# Patient Record
Sex: Male | Born: 1950 | Marital: Married
Health system: Midwestern US, Community
[De-identification: ages and names within clinical notes are randomized; demographics above are authoritative.]

---

## 2014-02-13 ENCOUNTER — Ambulatory Visit: Admit: 2014-02-13 | Discharge: 2014-02-13 | Attending: Family Medicine

## 2014-02-13 DIAGNOSIS — Z0289 Encounter for other administrative examinations: Secondary | ICD-10-CM

## 2014-02-13 NOTE — Progress Notes (Signed)
Subjective:      Patient ID: Launa GrillJoseph L Tatham is a 64 y.o. male.    HPI  FAA Exam form 8500 on file SECOND CLASS  Review of Systems    Objective:   Physical Exam  FAA Exam form 8500 on file    Assessment:      1. Encounter for [employer] Company[employer] Administration (FAA) examination       SECOND CLASS       Plan:      FAA Exam form 8500 on file

## 2015-03-20 ENCOUNTER — Ambulatory Visit: Admit: 2015-03-20 | Discharge: 2015-03-20 | Attending: Family Medicine

## 2015-03-20 DIAGNOSIS — Z0289 Encounter for other administrative examinations: Secondary | ICD-10-CM

## 2015-03-20 NOTE — Progress Notes (Signed)
Subjective:      Patient ID: Launa GrillJoseph L Ledlow is a 65 y.o. male.    HPI  FAA Exam form 8500 on file    Review of Systems    Objective:   Physical Exam  FAA Exam form 8500 on file    Assessment:      1. Encounter for [employer] Company[employer] Aviation Administration (FAA) examination             Plan:      FAA Exam form 8500 on file

## 2020-02-13 ENCOUNTER — Ambulatory Visit: Payer: Self-pay | Admitting: Family Medicine

## 2020-02-18 ENCOUNTER — Ambulatory Visit (INDEPENDENT_AMBULATORY_CARE_PROVIDER_SITE_OTHER): Payer: Commercial Managed Care - PPO | Admitting: Family Medicine

## 2020-02-18 ENCOUNTER — Other Ambulatory Visit: Payer: Self-pay

## 2020-02-18 DIAGNOSIS — M79601 Pain in right arm: Secondary | ICD-10-CM

## 2020-02-18 MED ORDER — PREDNISONE 10 MG PO TABS: ORAL_TABLET | ORAL | 0 refills | Status: DC

## 2020-02-18 NOTE — Progress Notes (Signed)
   Office Visit Note   Patient: Adam Lindsey           Date of Birth: Dec 02, 1950           MRN: [id number] Visit Date: 02/18/2020 Requested by: No referring provider defined for this encounter. PCP: No primary care provider on file.  Subjective: Chief Complaint  Patient presents with  . Right Shoulder - Pain    Had minor intermittent pain in the shoulder x several months. 10 days ago the pain worsened. He saw someone at his primary care office - was diagnosed with and treated for shingles (had a rash on his arm). Also, has a nodule that has come up on the right thumb - thumb is swollen.    HPI: He is here with right arm pain.  He had intermittent pain in his shoulder for several months, but about 10 days ago he developed increasing pain and a rash.  He went to his PCP and was diagnosed with shingles, treated with Lyrica and Valtrex.  His symptoms are more manageable with this but are not going away.  He has tingling into his thumb, he noticed a nodule in his and still having quite a bit of pain in his shoulder.  He is right-hand dominant.  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  No previous problems like this.  Is otherwise been in good health except for prostate enlargement.                ROS:   All other systems were reviewed and are negative.  Objective: Vital Signs: There were no vitals taken for this visit.  Physical Exam:  General:  Alert and oriented, in no acute distress. Pulm:  Breathing unlabored. Psy:  Normal mood, congruent affect. Skin: He has resolving rash in his right arm. Right shoulder: He has full range of motion, pain with overhead reach.  Tender in the posterior subacromial space.  Rotator cuff strength is 5/5 throughout.  5/5 deltoid, biceps, biceps, wrist and intrinsic hand strength.   Imaging: No results found.  Assessment & Plan: 1.  Acute on chronic right shoulder pain, suspect that he has some impingement in his  shoulder but that his acute severe pain is related to shingles. -We will try a prednisone taper.  If his shoulder pain persist, consider subacromial injection. -Desk work for the next 2 weeks while on Lyrica.     Procedures: No procedures performed        PMFS History: There are no problems to display for this patient.  No past medical history on file.  No family history on file.   Social History   Occupational History  . Not on file  Tobacco Use  . Smoking status: Not on file  . Smokeless tobacco: Not on file  Substance and Sexual Activity  . Alcohol use: Not on file  . Drug use: Not on file  . Sexual activity: Not on file

## 2020-02-19 ENCOUNTER — Ambulatory Visit: Payer: Self-pay | Admitting: Family Medicine

## 2020-02-21 ENCOUNTER — Ambulatory Visit: Payer: Self-pay | Admitting: Family Medicine

## 2020-02-27 ENCOUNTER — Encounter: Payer: Self-pay | Admitting: Family Medicine

## 2020-02-27 ENCOUNTER — Telehealth: Payer: Self-pay

## 2020-02-27 MED ORDER — PREGABALIN 150 MG PO CAPS
150.0000 mg | ORAL_CAPSULE | Freq: Two times a day (BID) | ORAL | 3 refills | Status: DC
Start: 2020-02-27 — End: 2020-02-27

## 2020-02-27 MED ORDER — PREGABALIN 200 MG PO CAPS: 200.0000 mg | ORAL_CAPSULE | Freq: Two times a day (BID) | ORAL | 3 refills | Status: DC

## 2020-02-27 NOTE — Telephone Encounter (Signed)
Yes, I forgot he was on 150 mg twice daily before, so increased to 200 mg.  Ok to fill.

## 2020-02-27 NOTE — Telephone Encounter (Signed)
I called the pharmacy and left a voice mail, advising that the Rx for 200 mg bid is the correct one and it is ok to fill it early.

## 2020-02-27 NOTE — Telephone Encounter (Signed)
[facility] called and needs clarification on medication (Lyrica). Would like to know which Rx to fill. There is multiple Lyrica Rx on file and is it okay to Fill early? Would like CB to discuss.      CB: [phone]

## 2020-02-27 NOTE — Addendum Note (Signed)
Addended by: Hortencia Pilar on: 02/27/2020 01:33 PM   Modules accepted: Orders

## 2020-02-27 NOTE — Telephone Encounter (Signed)
Please advise. Is the 200 mg at bid the right one? Is it ok to fill it early?

## 2020-03-05 ENCOUNTER — Encounter: Payer: Self-pay | Admitting: Family Medicine

## 2020-03-05 DIAGNOSIS — B0229 Other postherpetic nervous system involvement: Secondary | ICD-10-CM

## 2020-03-05 MED ORDER — NORTRIPTYLINE HCL 10 MG PO CAPS: ORAL_CAPSULE | ORAL | 3 refills | Status: AC

## 2020-03-05 MED ORDER — LIDOCAINE 5 % EX PTCH: 1.0000 | MEDICATED_PATCH | CUTANEOUS | 11 refills | Status: AC

## 2020-03-06 ENCOUNTER — Encounter: Payer: Self-pay | Admitting: Family Medicine

## 2020-03-07 ENCOUNTER — Other Ambulatory Visit: Payer: Self-pay

## 2020-03-07 ENCOUNTER — Ambulatory Visit (INDEPENDENT_AMBULATORY_CARE_PROVIDER_SITE_OTHER): Payer: Medicare Other | Admitting: Family Medicine

## 2020-03-07 DIAGNOSIS — M79601 Pain in right arm: Secondary | ICD-10-CM | POA: Diagnosis not present

## 2020-03-07 DIAGNOSIS — N400 Enlarged prostate without lower urinary tract symptoms: Secondary | ICD-10-CM | POA: Insufficient documentation

## 2020-03-07 DIAGNOSIS — H903 Sensorineural hearing loss, bilateral: Secondary | ICD-10-CM | POA: Insufficient documentation

## 2020-03-07 DIAGNOSIS — L570 Actinic keratosis: Secondary | ICD-10-CM | POA: Insufficient documentation

## 2020-03-07 DIAGNOSIS — H918X9 Other specified hearing loss, unspecified ear: Secondary | ICD-10-CM | POA: Insufficient documentation

## 2020-03-07 NOTE — Progress Notes (Signed)
   Office Visit Note   Patient: Adam Lindsey           Date of Birth: Dec 18, 1950           MRN: [id number] Visit Date: 03/07/2020 Requested by: No referring provider defined for this encounter. PCP: No primary care provider on file.  Subjective: Chief Complaint  Patient presents with  . Right Shoulder - Pain    Planned cortisone injection. What is the plan after this?    HPI: He is here with persistent right shoulder pain.  He is having ongoing pain deep within the right shoulder as well as shingles pain radiating into the arm down to the thumb.  He is hoping injection would help with his shoulder pain.              ROS:   All other systems were reviewed and are negative.  Objective: Vital Signs: There were no vitals taken for this visit.  Physical Exam:  General:  Alert and oriented, in no acute distress. Pulm:  Breathing unlabored. Psy:  Normal mood, congruent affect. Skin: Shingles rash is almost resolved. Right shoulder: Full active range of motion, 5/5 rotator cuff strength.  Tender in the posterior subacromial space.  Biceps, triceps, wrist and intrinsic hand strength are normal.  Imaging: No results found.  Assessment & Plan: 1.  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 -Discussed with him and elected to inject the subacromial space.  If he still has severe pain we will order x-rays and MRI scan of the shoulder.     Procedures: Right shoulder subacromial injection: After sterile prep with Betadine, injected 5 cc 0.25% bupivacaine and 6 mg betamethasone from posterior approach.       PMFS History: Patient Active Problem List   Diagnosis Date Noted  . Actinic keratosis 03/07/2020  . Benign prostatic hyperplasia 03/07/2020  . Other specified forms of hearing loss 03/07/2020  . 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  No family history on file.   Social History   Occupational History   . Not on file  Tobacco Use  . Smoking status: Not on file  . Smokeless tobacco: Not on file  Substance and Sexual Activity  . Alcohol use: Not on file  . Drug use: Not on file  . Sexual activity: Not on file

## 2020-03-09 ENCOUNTER — Encounter: Payer: Self-pay | Admitting: Family Medicine

## 2020-03-09 DIAGNOSIS — M79601 Pain in right arm: Secondary | ICD-10-CM

## 2020-03-20 ENCOUNTER — Other Ambulatory Visit: Payer: Self-pay

## 2020-03-20 ENCOUNTER — Ambulatory Visit
Admission: RE | Admit: 2020-03-20 | Discharge: 2020-03-20 | Disposition: A | Discharge status: Home / Self Care | Payer: Commercial Managed Care - PPO | Source: Ambulatory Visit | Attending: Family Medicine | Admitting: Family Medicine

## 2020-03-20 DIAGNOSIS — M79601 Pain in right arm: Secondary | ICD-10-CM

## 2020-03-24 ENCOUNTER — Other Ambulatory Visit: Payer: Self-pay | Admitting: Family Medicine

## 2020-03-24 ENCOUNTER — Telehealth: Payer: Self-pay | Admitting: Family Medicine

## 2020-03-24 DIAGNOSIS — M79601 Pain in right arm: Secondary | ICD-10-CM

## 2020-03-24 DIAGNOSIS — B0229 Other postherpetic nervous system involvement: Secondary | ICD-10-CM

## 2020-03-24 MED ORDER — PREGABALIN 200 MG PO CAPS: 200.0000 mg | ORAL_CAPSULE | Freq: Two times a day (BID) | ORAL | 3 refills | Status: DC

## 2020-03-24 NOTE — Telephone Encounter (Signed)
MRI shows irritation of the supraspinatus and infraspinatus tendons with a small partial tear of the supraspinatus.  These can often heal without surgery, so physical therapy would be a consideration.  If therapy doesn't help, then surgical consult.

## 2020-03-27 ENCOUNTER — Ambulatory Visit: Payer: Commercial Managed Care - PPO | Attending: Family Medicine | Admitting: Physical Therapy

## 2020-03-27 ENCOUNTER — Other Ambulatory Visit: Payer: Self-pay

## 2020-03-27 ENCOUNTER — Encounter: Payer: Self-pay | Admitting: Physical Therapy

## 2020-03-27 DIAGNOSIS — M25511 Pain in right shoulder: Secondary | ICD-10-CM | POA: Insufficient documentation

## 2020-03-27 DIAGNOSIS — G8929 Other chronic pain: Secondary | ICD-10-CM | POA: Diagnosis present

## 2020-03-27 DIAGNOSIS — R293 Abnormal posture: Secondary | ICD-10-CM | POA: Diagnosis present

## 2020-03-27 DIAGNOSIS — M6281 Muscle weakness (generalized): Secondary | ICD-10-CM | POA: Diagnosis present

## 2020-03-27 DIAGNOSIS — R29898 Other symptoms and signs involving the musculoskeletal system: Secondary | ICD-10-CM | POA: Diagnosis present

## 2020-03-27 NOTE — Therapy (Signed)
[facility] [address]  [place], [place], [address] Phone: [phone]   Fax:  [phone]  Physical Therapy Evaluation  Patient Details  Name: Adam Lindsey MRN: [id number] Date of Birth: 05/03/1950 Referring Provider (PT): Eunice Blase, MD   Encounter Date: 03/27/2020   PT End of Session - 03/27/20 1401    Visit Number 1    Number of Visits 12    Date for PT Re-Evaluation 05/08/20    Authorization Type UHC, Medicare & Tricare    PT Start Time 1401    PT Stop Time 1454    PT Time Calculation (min) 53 min    Activity Tolerance Patient tolerated treatment well    Behavior During Therapy [facility] for tasks assessed/performed           History reviewed. No pertinent past medical history.  History reviewed. No pertinent surgical history.  There were no vitals filed for this visit.    Subjective Assessment - 03/27/20 1408    Subjective Pt reports h/o swimming and playing volleyball where he would have intermittent pain. More recently pain has become more constant and intense for the past 5-6 months which he feels like may have originated from working out. Received coritisone injection with 1-2 days of relief but then wore off.    Patient is accompained by: Family member   wife   Pertinent History 5-6 months ago aggravated by working out    Limitations Lifting    Diagnostic tests 03/20/20 - R shoulder MRI: 1. Moderate tendinosis of the supraspinatus tendon with a small partial-thickness bursal surface tear.  2. Mild tendinosis of the infraspinatus tendon. Severe arthropathy of the acromioclavicular joint.    Patient Stated Goals relieve the pain to get back to work and working out    Currently in Pain? No/denies    Pain Score 0-No pain    Pain Location Shoulder    Pain Orientation Right;Anterior    Pain Descriptors / Indicators Aching    Pain Type Chronic pain;Neuropathic pain    Pain Radiating Towards  Neuropathic pain & loss of sensation in R lateral forearm, thumb, and index finger 2 shingles    Pain Onset More than a month ago    Pain Frequency Intermittent    Aggravating Factors  Lifting aggravates, laying on the shoulder   7-8/10   Pain Relieving Factors Ice and ibuprofen, uses ice at night    Effect of Pain on Daily Activities feels like there's full motion but can be painful overhead and limited with lifting              [facility] PT Assessment - 03/27/20 1424      Assessment   Medical Diagnosis Right shoulder pain - probable impingement    Referring Provider (PT) Eunice Blase, MD    Onset Date/Surgical Date --   5-6 months ago   Hand Dominance Right    Next MD Visit PRN    Prior Therapy None      Precautions   Precautions None      Restrictions   Weight Bearing Restrictions No      Balance Screen   Has the patient fallen in the past 6 months Yes    How many times? 1   tripped over laundry basket   Has the patient had a decrease in activity level because of a fear of falling?  No    Is the patient reluctant to leave their home  because of a fear of falling?  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    [employer] gym, pretty active, 3x/week      Cognition   Overall Cognitive Status Within Functional Limits for tasks assessed      Sensation   Additional Comments Neuropathic pain & loss of sensation in R lateral forearm, thumb, and index finger 2 shingles      Posture/Postural Control   Posture/Postural Control Postural limitations    Postural Limitations Rounded Shoulders     Posture Comments Slighlty protracted R scapula 1-2cm      ROM / Strength   AROM / PROM / Strength AROM;Strength      AROM   Right Shoulder Flexion 157 Degrees    Right Shoulder ABduction 168 Degrees    Right Shoulder Internal Rotation --   FIR to T10 - more lateral over ribs   Right Shoulder External Rotation --   FER to T3   Left Shoulder Flexion 166 Degrees    Left Shoulder ABduction 167 Degrees    Left Shoulder Internal Rotation --   FIR to T10 at spinus process   Left Shoulder External Rotation --   FER to T4     Strength   Right Shoulder Flexion 4+/5    Right Shoulder ABduction 4/5   Painful   Right Shoulder Internal Rotation 4+/5    Right Shoulder External Rotation 4/5    Left Shoulder Flexion 5/5    Left Shoulder ABduction 5/5    Left Shoulder Internal Rotation 5/5    Left Shoulder External Rotation 4+/5      Palpation   Palpation comment TTP at Coracoid process, Very TTP Anterior deltoid with prominent taut identified, TTP lateral deltoid                      Objective measurements completed on examination: See above findings.               PT Education - 03/27/20 1447    Education Details PT eval findings, anticipated POC & initial HEP - Access Code: [email or web address]    Person(s) Educated Patient;Spouse    Methods Explanation;Demonstration;Verbal cues;Tactile cues;Handout    Comprehension Verbalized understanding;Verbal cues required;Tactile cues required;Returned demonstration;Need further instruction            PT Short Term Goals - 03/27/20 1454      PT SHORT TERM GOAL #1   Title Patient will be independent with initial HEP    Status New             PT Long Term Goals - 03/27/20 1454      PT LONG TERM GOAL #1   Title Patient will be independent with ongoing/advanced HEP +/- home gym program for self-management at home    Status New    Target Date 05/08/20      PT LONG TERM GOAL #2   Title Improve posture and alignment with  patient to demonstrate improved upright posture with posterior shoulder girdle engaged  Status New    Target Date 05/08/20      PT LONG TERM GOAL #3   Title Patient to improve R shoulder AROM to WNL without pain provocation    Status New    Target Date 05/08/20      PT LONG TERM GOAL #4   Title Patient will demonstrate improved R shoulder strength to >/= 5/5 for functional UE use    Status New    Target Date 05/08/20      PT LONG TERM GOAL #5   Title Patient to return to working out w/o limitation due to R shoulder pain    Status New    Target Date 05/08/20      PT LONG TERM GOAL #6   Title Patient to report ability to perform work and daily activities including lifting/carrying his suitcase without pain provocation    Status New    Target Date 05/08/20                  Plan - 03/27/20 1454    Clinical Impression Statement Adam Lindsey is a 70 y/o male who presents to OP PT for R shoulder pain along with R arm pain 2 post herpetic neuralgia. He attributes onset of R shoulder pain to potentially overdoing it while working out ~5-6 months ago. Before he could seek treatment for the shoulder, he developed shingles affecting his R arm and still has lingering neuropathic pain and loss of sensation in R forearm and thenar aspect of his hand as well as thumb & index finger due to the shingles. He has recently received an injection at the MD office w/o lasting relief noted. Recent MRI revealed moderate tendinosis of the supraspinatus tendon with a small partial-thickness bursal surface tear along with mild tendinosis of the infraspinatus tendon and severe arthropathy of the AC joint. Current deficits include postural abnormalities, very mildly limited R shoulder AROM, mildly decreased R shoulder strength and limited functional use of R arm especially with lifting. Adam Lindsey will benefit from skilled PT to address above deficits, improve posture and restore pain-free functional ROM and strength in R  shoulder to allow him to resume normal daily activities and working out without pain interference.    Personal Factors and Comorbidities Time since onset of injury/illness/exacerbation;Past/Current Experience;Comorbidity 2    Comorbidities recent episode of shingles in R UE - neuropathic pain & loss of sensation in R lateral forearm, thumb, and index finger 2 shingles; heraing loss    Examination-Activity Limitations Lift;Carry;Reach Overhead;Sleep    Examination-Participation Restrictions Occupation;Community Activity;Yard Work    Stability/Clinical Decision Making Stable/Uncomplicated    Designer, jewellery Low    Rehab Potential Good    PT Frequency 2x / week   1-2x/wk pending work schedule   PT Duration 6 weeks    PT Treatment/Interventions ADLs/Self Care Home Management;Cryotherapy;Electrical Stimulation;Iontophoresis 4mg /ml Dexamethasone;Moist Heat;Ultrasound;Therapeutic activities;Therapeutic exercise;Neuromuscular re-education;Patient/family education;Manual techniques;Passive range of motion;Dry needling;Taping;Vasopneumatic Device;Joint Manipulations    PT Next Visit Plan Shoulder FOTO; review initial HEP; postural stretching and scapular strengthening; manual therapy and modalities including possible ionto patch as indicated    PT Home Exercise Plan Access Code: [email or web address] (3/17)    Consulted and Agree with Plan of Care Patient;Family member/caregiver    Family Member Consulted wife           Patient will benefit from skilled therapeutic intervention in order to improve the following deficits and impairments:  Decreased activity tolerance,Decreased range of motion,Decreased strength,Increased muscle spasms,Impaired perceived functional ability,Impaired UE  functional use,Improper body mechanics,Postural dysfunction,Pain  Visit Diagnosis: Chronic right shoulder pain  Abnormal posture  Other symptoms and signs involving the musculoskeletal system  Muscle weakness  (generalized)     Problem List Patient Active Problem List   Diagnosis Date Noted  . Actinic keratosis 03/07/2020  . Benign prostatic hyperplasia 03/07/2020  . Other specified forms of hearing loss 03/07/2020  . 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 [address]  [place], [place], [address] Phone: [phone]   Fax:  [phone]  Name: Trejuan Matherne MRN: [id number] Date of Birth: August 04, 1950

## 2020-03-27 NOTE — Patient Instructions (Signed)
      Access Code: [email or web address] URL: [email or web address] Date: 03/27/2020 Prepared by: Annie Paras  Exercises Doorway Pec Stretch at 60 Degrees Abduction with Arm Straight - 2-3 x daily - 7 x weekly - 3 reps - 30 sec hold Doorway Pec Stretch at 90 Degrees Abduction - 2-3 x daily - 7 x weekly - 3 reps - 30 sec hold Doorway Pec Stretch at 120 Degrees Abduction - 2-3 x daily - 7 x weekly - 3 reps - 30 sec hold Standing Bilateral Low Shoulder Row with Anchored Resistance - 1 x daily - 7 x weekly - 2 sets - 10 reps - 5 sec hold Scapular Retraction with Resistance Advanced - 1 x daily - 7 x weekly - 2 sets - 10 reps - 5 sec hold

## 2020-03-28 ENCOUNTER — Encounter: Payer: Self-pay | Admitting: Family Medicine

## 2020-03-29 ENCOUNTER — Other Ambulatory Visit: Payer: No Typology Code available for payment source

## 2020-03-30 ENCOUNTER — Encounter: Payer: Self-pay | Admitting: Family Medicine

## 2020-03-31 ENCOUNTER — Telehealth: Payer: Self-pay

## 2020-03-31 MED ORDER — PREGABALIN 50 MG PO CAPS: ORAL_CAPSULE | ORAL | 0 refills | Status: AC

## 2020-03-31 NOTE — Telephone Encounter (Signed)
heather from [employer] called she stated the patient received lyrica 200 mg last week she just wants to make sure patient is being tapered off of lyrica call back:[phone]

## 2020-03-31 NOTE — Telephone Encounter (Signed)
I called and spoke with Barnetta Chapel in the pharmacy, confirming that the patient is tapering off of Lyrica.

## 2020-04-02 ENCOUNTER — Ambulatory Visit: Payer: Commercial Managed Care - PPO

## 2020-04-02 ENCOUNTER — Other Ambulatory Visit: Payer: Self-pay

## 2020-04-02 VITALS — BP 128/70

## 2020-04-02 DIAGNOSIS — R29898 Other symptoms and signs involving the musculoskeletal system: Secondary | ICD-10-CM

## 2020-04-02 DIAGNOSIS — M25511 Pain in right shoulder: Secondary | ICD-10-CM

## 2020-04-02 DIAGNOSIS — R293 Abnormal posture: Secondary | ICD-10-CM

## 2020-04-02 DIAGNOSIS — M6281 Muscle weakness (generalized): Secondary | ICD-10-CM

## 2020-04-02 DIAGNOSIS — G8929 Other chronic pain: Secondary | ICD-10-CM

## 2020-04-02 NOTE — Therapy (Signed)
[facility] [address]  [place], [place], [address] Phone: [phone]   Fax:  [phone]  Physical Therapy Treatment  Patient Details  Name: Adam Lindsey MRN: [id number] Date of Birth: 1950/02/03 Referring Provider (PT): Eunice Blase, MD   Encounter Date: 04/02/2020   PT End of Session - 04/02/20 1745    Visit Number 2    Number of Visits 12    Date for PT Re-Evaluation 05/08/20    Authorization Type UHC, Medicare & Tricare    PT Start Time 1656    PT Stop Time 1739    PT Time Calculation (min) 43 min    Activity Tolerance Patient tolerated treatment well    Behavior During Therapy [facility] for tasks assessed/performed           History reviewed. No pertinent past medical history.  History reviewed. No pertinent surgical history.  Vitals:   04/02/20 1746  BP: 128/70     Subjective Assessment - 04/02/20 1658    Subjective Pt reports the exercises have been going well for him, no pain today.    Diagnostic tests 03/20/20 - R shoulder MRI: 1. Moderate tendinosis of the supraspinatus tendon with a small partial-thickness bursal surface tear.  2. Mild tendinosis of the infraspinatus tendon. Severe arthropathy of the acromioclavicular joint.    Patient Stated Goals relieve the pain to get back to work and working out    Currently in Pain? No/denies              [facility] PT Assessment - 04/02/20 0001      Observation/Other Assessments   Focus on Therapeutic Outcomes (FOTO)  Shoulder: 63, Predicted: 71                         [facility] Adult PT Treatment/Exercise - 04/02/20 0001      Exercises   Exercises Shoulder      Shoulder Exercises: Standing   External Rotation Strengthening;Right;10 reps;Theraband    Theraband Level (Shoulder External Rotation) Level 3 (Green)    Internal Rotation Strengthening;Right;10 reps;Theraband    Theraband Level (Shoulder Internal Rotation) Level 3 (Green)     Flexion AAROM;Both;10 reps;Limitations    Flexion Limitations cane    ABduction AAROM;Right;10 reps;Limitations    ABduction Limitations cane    Extension Strengthening;Both;10 reps;Theraband    Theraband Level (Shoulder Extension) Level 3 (Green)    Row Strengthening;Both;10 reps;Theraband    Theraband Level (Shoulder Row) Level 3 (Green)      Shoulder Exercises: ROM/Strengthening   UBE (Upper Arm Bike) L1 2 min fwd, 2 min back    Wall Wash flexion and abduction 10 reps      Shoulder Exercises: Stretch   Other Shoulder Stretches chest doorway stretch 3 way 30 sec B                  PT Education - 04/02/20 1743    Education Details HEP update: Access Code: [email or web address]    Person(s) Educated Patient    Methods Explanation;Demonstration;Tactile cues;Verbal cues;Handout    Comprehension Verbalized understanding;Returned demonstration;Verbal cues required;Tactile cues required;Need further instruction            PT Short Term Goals - 04/02/20 1852      PT SHORT TERM GOAL #1   Title Patient will be independent with initial HEP    Status On-going             PT  Long Term Goals - 04/02/20 1853      PT LONG TERM GOAL #1   Title Patient will be independent with ongoing/advanced HEP +/- home gym program for self-management at home    Status On-going      PT LONG TERM GOAL #2   Title Improve posture and alignment with patient to demonstrate improved upright posture with posterior shoulder girdle engaged    Status On-going      PT LONG TERM GOAL #3   Title Patient to improve R shoulder AROM to WNL without pain provocation    Status On-going      PT LONG TERM GOAL #4   Title Patient will demonstrate improved R shoulder strength to >/= 5/5 for functional UE use    Status On-going      PT LONG TERM GOAL #5   Title Patient to return to working out w/o limitation due to R shoulder pain    Status On-going      PT LONG TERM GOAL #6   Title Patient to report ability  to perform work and daily activities including lifting/carrying his suitcase without pain provocation    Status On-going                 Plan - 04/02/20 1746    Clinical Impression Statement Pt responded well to the treatment, reviewed his HEP to ensure proper performance and understanding, he demonstrated the exercises well after being instructed on proper form and technique. No reports of pain during the session, he had some reports of "discomfort" in his shoulder when reaching into abduction and showed decreased ROM. Introduced more RTC and scap stability exercises today, some cues needed for proper forearm and shoulder placement with IR/ER but no complaints with the exercises.    Personal Factors and Comorbidities Time since onset of injury/illness/exacerbation;Past/Current Experience;Comorbidity 2    Comorbidities recent episode of shingles in R UE - neuropathic pain & loss of sensation in R lateral forearm, thumb, and index finger 2 shingles; heraing loss    PT Frequency 2x / week    PT Duration 6 weeks    PT Treatment/Interventions ADLs/Self Care Home Management;Cryotherapy;Electrical Stimulation;Iontophoresis 4mg /ml Dexamethasone;Moist Heat;Ultrasound;Therapeutic activities;Therapeutic exercise;Neuromuscular re-education;Patient/family education;Manual techniques;Passive range of motion;Dry needling;Taping;Vasopneumatic Device;Joint Manipulations    PT Next Visit Plan postural stretching and scapular strengthening; manual therapy and modalities including possible ionto patch as indicated    PT Home Exercise Plan Access Code: [email or web address] (3/17)    Consulted and Agree with Plan of Care Patient;Family member/caregiver    Family Member Consulted wife           Patient will benefit from skilled therapeutic intervention in order to improve the following deficits and impairments:  Decreased activity tolerance,Decreased range of motion,Decreased strength,Increased muscle spasms,Impaired  perceived functional ability,Impaired UE functional use,Improper body mechanics,Postural dysfunction,Pain  Visit Diagnosis: Chronic right shoulder pain  Abnormal posture  Other symptoms and signs involving the musculoskeletal system  Muscle weakness (generalized)     Problem List Patient Active Problem List   Diagnosis Date Noted  . Actinic keratosis 03/07/2020  . Benign prostatic hyperplasia 03/07/2020  . Other specified forms of hearing loss 03/07/2020  . 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  [place], [place], [address] Phone: [phone]   Fax:  [phone]  Name: Anguel Delapena MRN: [id number] Date of Birth: 09/08/1950

## 2020-04-03 ENCOUNTER — Other Ambulatory Visit: Payer: Self-pay | Admitting: Urology

## 2020-04-03 ENCOUNTER — Encounter: Payer: Self-pay | Admitting: Family Medicine

## 2020-04-03 DIAGNOSIS — R972 Elevated prostate specific antigen [PSA]: Secondary | ICD-10-CM

## 2020-04-04 MED ORDER — VALACYCLOVIR HCL 1 G PO TABS: 1000.0000 mg | ORAL_TABLET | Freq: Three times a day (TID) | ORAL | 1 refills | Status: AC

## 2020-04-04 NOTE — Addendum Note (Signed)
Addended by: Hortencia Pilar on: 04/04/2020 07:31 AM   Modules accepted: Orders

## 2020-04-07 ENCOUNTER — Ambulatory Visit: Payer: Medicare Other | Admitting: Physical Therapy

## 2020-04-09 ENCOUNTER — Ambulatory Visit: Payer: Commercial Managed Care - PPO

## 2020-04-09 ENCOUNTER — Other Ambulatory Visit: Payer: Self-pay

## 2020-04-09 DIAGNOSIS — R293 Abnormal posture: Secondary | ICD-10-CM

## 2020-04-09 DIAGNOSIS — G8929 Other chronic pain: Secondary | ICD-10-CM

## 2020-04-09 DIAGNOSIS — M25511 Pain in right shoulder: Secondary | ICD-10-CM

## 2020-04-09 DIAGNOSIS — M6281 Muscle weakness (generalized): Secondary | ICD-10-CM

## 2020-04-09 DIAGNOSIS — R29898 Other symptoms and signs involving the musculoskeletal system: Secondary | ICD-10-CM

## 2020-04-09 NOTE — Therapy (Signed)
[facility] [address]  [place], [place], [address] Phone: [phone]   Fax:  [phone]  Physical Therapy Treatment  Patient Details  Name: Adam Lindsey MRN: [id number] Date of Birth: 03-18-1950 Referring Provider (PT): Eunice Blase, MD   Encounter Date: 04/09/2020   PT End of Session - 04/09/20 1656    Visit Number 3    Number of Visits 12    Date for PT Re-Evaluation 05/08/20    Authorization Type UHC, Medicare & Tricare    PT Start Time 9381    PT Stop Time 1655    PT Time Calculation (min) 38 min    Activity Tolerance Patient tolerated treatment well    Behavior During Therapy [facility] for tasks assessed/performed           History reviewed. No pertinent past medical history.  History reviewed. No pertinent surgical history.  There were no vitals filed for this visit.   Subjective Assessment - 04/09/20 1619    Subjective Pt believes he slept wrong on her R shoulder, it has been bothering him.    Patient is accompained by: Family member    Pertinent History 5-6 months ago aggravated by working out    Diagnostic tests 03/20/20 - R shoulder MRI: 1. Moderate tendinosis of the supraspinatus tendon with a small partial-thickness bursal surface tear.  2. Mild tendinosis of the infraspinatus tendon. Severe arthropathy of the acromioclavicular joint.    Patient Stated Goals relieve the pain to get back to work and working out    Currently in Pain? No/denies   aggravation in the shoulder                            [facility] Adult PT Treatment/Exercise - 04/09/20 0001      Exercises   Exercises Shoulder      Shoulder Exercises: Standing   External Rotation Strengthening;Right;10 reps;Theraband    Theraband Level (Shoulder External Rotation) Level 3 (Green)    Internal Rotation Strengthening;Right;10 reps;Theraband    Theraband Level (Shoulder Internal Rotation) Level 3 (Green)    Flexion  Strengthening;Right;10 reps;Weights    Shoulder Flexion Weight (lbs) 1    ABduction Strengthening;Both;10 reps;Weights    Shoulder ABduction Weight (lbs) 1    Other Standing Exercises snow angle with back against wall 15 reps      Shoulder Exercises: Therapy Ball   Flexion Right;Left;10 reps      Shoulder Exercises: ROM/Strengthening   UBE (Upper Arm Bike) L1 28min fwd/3 min back    Lat Pull 10 reps   10#   Cybex Row --   10# 10 reps cues to retract scap     Manual Therapy   Manual Therapy Passive ROM;Soft tissue mobilization    Soft tissue mobilization R prox bicep tendon and ant deltoid    Passive ROM R shoulder all motions                    PT Short Term Goals - 04/02/20 1852      PT SHORT TERM GOAL #1   Title Patient will be independent with initial HEP    Status On-going             PT Long Term Goals - 04/02/20 1853      PT LONG TERM GOAL #1   Title Patient will be independent with ongoing/advanced HEP +/- home gym program for self-management  at home    Status On-going      PT LONG TERM GOAL #2   Title Improve posture and alignment with patient to demonstrate improved upright posture with posterior shoulder girdle engaged    Status On-going      PT LONG TERM GOAL #3   Title Patient to improve R shoulder AROM to WNL without pain provocation    Status On-going      PT LONG TERM GOAL #4   Title Patient will demonstrate improved R shoulder strength to >/= 5/5 for functional UE use    Status On-going      PT LONG TERM GOAL #5   Title Patient to return to working out w/o limitation due to R shoulder pain    Status On-going      PT LONG TERM GOAL #6   Title Patient to report ability to perform work and daily activities including lifting/carrying his suitcase without pain provocation    Status On-going                 Plan - 04/09/20 1656    Clinical Impression Statement Pt came in reporting some aggravation in his R shoulder from sleeping  on it. He had no reports of pain during the session and cont to perform the exercises well with some cueing for keeping the posterior complex engaged and preventing scap elevation. Pt showed an increased in pain free abduction ROM during snow angels even though formal measurements were not taken. Pt educated on posture and keeping shoulder good positioning to avoid excessive stress and for better postural alignement.    Personal Factors and Comorbidities Time since onset of injury/illness/exacerbation;Past/Current Experience;Comorbidity 2    Comorbidities recent episode of shingles in R UE - neuropathic pain & loss of sensation in R lateral forearm, thumb, and index finger 2 shingles; heraing loss    PT Frequency 2x / week    PT Duration 6 weeks    PT Treatment/Interventions ADLs/Self Care Home Management;Cryotherapy;Electrical Stimulation;Iontophoresis 4mg /ml Dexamethasone;Moist Heat;Ultrasound;Therapeutic activities;Therapeutic exercise;Neuromuscular re-education;Patient/family education;Manual techniques;Passive range of motion;Dry needling;Taping;Vasopneumatic Device;Joint Manipulations    PT Next Visit Plan postural stretching and scapular strengthening; manual therapy and modalities including possible ionto patch as indicated    PT Home Exercise Plan Access Code: [email or web address] (3/17)    Consulted and Agree with Plan of Care Patient;Family member/caregiver    Family Member Consulted wife           Patient will benefit from skilled therapeutic intervention in order to improve the following deficits and impairments:  Decreased activity tolerance,Decreased range of motion,Decreased strength,Increased muscle spasms,Impaired perceived functional ability,Impaired UE functional use,Improper body mechanics,Postural dysfunction,Pain  Visit Diagnosis: Chronic right shoulder pain  Abnormal posture  Other symptoms and signs involving the musculoskeletal system  Muscle weakness  (generalized)     Problem List Patient Active Problem List   Diagnosis Date Noted  . Actinic keratosis 03/07/2020  . Benign prostatic hyperplasia 03/07/2020  . Other specified forms of hearing loss 03/07/2020  . Sensory hearing loss, bilateral 03/07/2020    Artist Pais, PTA 04/09/2020, 5:54 PM  [facility] [address]  [place], [place], [address] Phone: [phone]   Fax:  [phone]  Name: Ellwood Steidle MRN: [id number] Date of Birth: Nov 09, 1950

## 2020-04-16 ENCOUNTER — Encounter: Payer: Commercial Managed Care - PPO | Admitting: Physical Therapy

## 2020-04-23 ENCOUNTER — Other Ambulatory Visit: Payer: Self-pay

## 2020-04-23 ENCOUNTER — Ambulatory Visit: Payer: Commercial Managed Care - PPO | Attending: Family Medicine

## 2020-04-23 DIAGNOSIS — R29898 Other symptoms and signs involving the musculoskeletal system: Secondary | ICD-10-CM

## 2020-04-23 DIAGNOSIS — R293 Abnormal posture: Secondary | ICD-10-CM

## 2020-04-23 DIAGNOSIS — M25511 Pain in right shoulder: Secondary | ICD-10-CM | POA: Insufficient documentation

## 2020-04-23 DIAGNOSIS — M6281 Muscle weakness (generalized): Secondary | ICD-10-CM

## 2020-04-23 DIAGNOSIS — G8929 Other chronic pain: Secondary | ICD-10-CM | POA: Diagnosis present

## 2020-04-23 NOTE — Therapy (Signed)
[facility] [address]  [place], [place], [address] Phone: [phone]   Fax:  [phone]  Physical Therapy Treatment  Patient Details  Name: Adam Lindsey MRN: [id number] Date of Birth: [birth year] Referring Provider (PT): Eunice Blase, MD   Encounter Date: 04/23/2020   PT End of Session - 04/23/20 1659    Visit Number 4    Number of Visits 12    Date for PT Re-Evaluation 05/08/20    Authorization Type UHC, Medicare & Tricare    PT Start Time 1616    PT Stop Time 1655    PT Time Calculation (min) 39 min    Activity Tolerance Patient tolerated treatment well    Behavior During Therapy [facility] for tasks assessed/performed           History reviewed. No pertinent past medical history.  History reviewed. No pertinent surgical history.  There were no vitals filed for this visit.   Subjective Assessment - 04/23/20 1618    Subjective Pt has been out of town, hasn't been doing exercises as much as he would d/t being busy.    Pertinent History 5-6 months ago aggravated by working out    Diagnostic tests 03/20/20 - R shoulder MRI: 1. Moderate tendinosis of the supraspinatus tendon with a small partial-thickness bursal surface tear.  2. Mild tendinosis of the infraspinatus tendon. Severe arthropathy of the acromioclavicular joint.    Patient Stated Goals relieve the pain to get back to work and working out    Currently in Pain? No/denies              [facility] PT Assessment - 04/23/20 0001      AROM   Right Shoulder Flexion 159 Degrees    Right Shoulder ABduction 155 Degrees      Strength   Right Shoulder Flexion 4+/5    Right Shoulder ABduction 4+/5    Right Shoulder Internal Rotation 4+/5    Right Shoulder External Rotation 4/5                         [facility] Adult PT Treatment/Exercise - 04/23/20 0001      Exercises   Exercises Shoulder      Shoulder Exercises: Seated   External Rotation  Strengthening;Right;Weights;20 reps;Theraband    External Rotation Limitations 10 reps 2# with elbow on orange pball; 10 reps with G Tband both sides    Flexion Strengthening;Right;10 reps;Theraband    Theraband Level (Shoulder Flexion) Level 1 (Yellow)      Shoulder Exercises: Sidelying   External Rotation Strengthening;Right;10 reps;Weights    External Rotation Weight (lbs) 2    Internal Rotation Strengthening;Right;10 reps;Weights    Internal Rotation Weight (lbs) 2    ABduction AAROM;Right;10 reps;Weights    ABduction Weight (lbs) 1      Shoulder Exercises: ROM/Strengthening   UBE (Upper Arm Bike) L1 28mn fwd/3 min back    Lat Pull 10 reps   20#   Cybex Row 10 reps    Cybex Row Limitations 20#                  PT Education - 04/23/20 1658    Education Details HEP update: Access Code: [email or web address]   Person(s) Educated Patient    Methods Explanation;Demonstration;Verbal cues;Handout    Comprehension Verbalized understanding;Returned demonstration;Verbal cues required            PT Short Term Goals - 04/23/20  1700      PT SHORT TERM GOAL #1   Title Patient will be independent with initial HEP    Status Achieved             PT Long Term Goals - 04/23/20 1626      PT LONG TERM GOAL #1   Title Patient will be independent with ongoing/advanced HEP +/- home gym program for self-management at home    Status On-going      PT LONG TERM GOAL #2   Title Improve posture and alignment with patient to demonstrate improved upright posture with posterior shoulder girdle engaged    Status Partially Met   posterior shld girdle engaged at rest, slightly fwd trunk lean     PT LONG TERM GOAL #3   Title Patient to improve R shoulder AROM to WNL without pain provocation    Status Achieved      PT LONG TERM GOAL #4   Title Patient will demonstrate improved R shoulder strength to >/= 5/5 for functional UE use    Status On-going      PT LONG TERM GOAL #5   Title Patient to  return to working out w/o limitation due to R shoulder pain    Status On-going      PT LONG TERM GOAL #6   Title Patient to report ability to perform work and daily activities including lifting/carrying his suitcase without pain provocation    Status On-going                 Plan - 04/23/20 1700    Clinical Impression Statement Pt is showing improvements in shoulder AROM w/o any pain so has met LTG 3. He is showing better posture with post shoulder girdle engaged but has a flexed posture needing cues to squeeze glutes to keep back upright. He responded well to treatment reporting, no pain. Focused more on RTC strength for stability of R scapula when doing OH movements. Educated pt on slowly working into light resisted exercise at home gym but to monitor his symptoms and avoid overlifting. Added shoulder flexion and abduction to HEP giving demonstration with pt verbalizing understanding. Pt is progressing toward goals.    Personal Factors and Comorbidities Time since onset of injury/illness/exacerbation;Past/Current Experience;Comorbidity 2    Comorbidities recent episode of shingles in R UE - neuropathic pain & loss of sensation in R lateral forearm, thumb, and index finger 2 shingles; heraing loss    PT Frequency 2x / week    PT Duration 6 weeks    PT Treatment/Interventions ADLs/Self Care Home Management;Cryotherapy;Electrical Stimulation;Iontophoresis 5m/ml Dexamethasone;Moist Heat;Ultrasound;Therapeutic activities;Therapeutic exercise;Neuromuscular re-education;Patient/family education;Manual techniques;Passive range of motion;Dry needling;Taping;Vasopneumatic Device;Joint Manipulations    PT Next Visit Plan postural stretching and scapular strengthening; manual therapy and modalities including possible ionto patch as indicated    PT Home Exercise Plan Access Code: [email or web address] (3/17)           Patient will benefit from skilled therapeutic intervention in order to improve the  following deficits and impairments:  Decreased activity tolerance,Decreased range of motion,Decreased strength,Increased muscle spasms,Impaired perceived functional ability,Impaired UE functional use,Improper body mechanics,Postural dysfunction,Pain  Visit Diagnosis: Chronic right shoulder pain  Abnormal posture  Other symptoms and signs involving the musculoskeletal system  Muscle weakness (generalized)     Problem List Patient Active Problem List   Diagnosis Date Noted  . Actinic keratosis 03/07/2020  . Benign prostatic hyperplasia 03/07/2020  . Other specified forms of hearing loss 03/07/2020  . Sensory  hearing loss, bilateral 03/07/2020    Artist Pais, PTA 04/23/2020, 6:00 PM  [facility] [address]  [place], [place], [address] Phone: [phone]   Fax:  [phone]  Name: Adam Lindsey MRN: [id number] Date of Birth: 03/12/1950

## 2020-04-30 ENCOUNTER — Other Ambulatory Visit: Payer: Self-pay

## 2020-04-30 ENCOUNTER — Ambulatory Visit: Payer: Commercial Managed Care - PPO

## 2020-04-30 ENCOUNTER — Other Ambulatory Visit: Payer: Commercial Managed Care - PPO

## 2020-04-30 DIAGNOSIS — R293 Abnormal posture: Secondary | ICD-10-CM

## 2020-04-30 DIAGNOSIS — M25511 Pain in right shoulder: Secondary | ICD-10-CM | POA: Diagnosis not present

## 2020-04-30 DIAGNOSIS — R29898 Other symptoms and signs involving the musculoskeletal system: Secondary | ICD-10-CM

## 2020-04-30 DIAGNOSIS — M6281 Muscle weakness (generalized): Secondary | ICD-10-CM

## 2020-04-30 DIAGNOSIS — G8929 Other chronic pain: Secondary | ICD-10-CM

## 2020-04-30 NOTE — Therapy (Signed)
[facility] [address]  [place], [place], [address] Phone: [phone]   Fax:  [phone]  Physical Therapy Treatment  Patient Details  Name: Adam Lindsey MRN: [id number] Date of Birth: 27-Dec-1950 Referring Provider (PT): Eunice Blase, MD   Encounter Date: 04/30/2020   PT End of Session - 04/30/20 1655    Visit Number 5    Number of Visits 12    Date for PT Re-Evaluation 05/08/20    Authorization Type UHC, Medicare & Tricare    PT Start Time 1616    PT Stop Time 1656    PT Time Calculation (min) 40 min    Activity Tolerance Patient tolerated treatment well    Behavior During Therapy [facility] for tasks assessed/performed           History reviewed. No pertinent past medical history.  History reviewed. No pertinent surgical history.  There were no vitals filed for this visit.   Subjective Assessment - 04/30/20 1618    Subjective Shoulder has been doing good.    Pertinent History 5-6 months ago aggravated by working out    Diagnostic tests 03/20/20 - R shoulder MRI: 1. Moderate tendinosis of the supraspinatus tendon with a small partial-thickness bursal surface tear.  2. Mild tendinosis of the infraspinatus tendon. Severe arthropathy of the acromioclavicular joint.    Patient Stated Goals relieve the pain to get back to work and working out    Currently in Pain? No/denies   "slight irritation when reaching [facility] but not all the time"             [facility] PT Assessment - 04/30/20 0001      Strength   Right Shoulder Flexion 5/5    Right Shoulder ABduction 5/5    Right Shoulder Internal Rotation 5/5    Right Shoulder External Rotation 5/5    Left Shoulder External Rotation 5/5                         [facility] Adult PT Treatment/Exercise - 04/30/20 0001      Exercises   Exercises Shoulder      Shoulder Exercises: Prone   Other Prone Exercises reverse fly both sides, kneeling onto orange pball  10 reps with 1# weight    Other Prone Exercises kneeling push up onto orange pball 10 reps      Shoulder Exercises: Standing   External Rotation Strengthening;Right;20 reps;Theraband    Theraband Level (Shoulder External Rotation) Level 3 (Green)    External Rotation Limitations 2x10    Internal Rotation Strengthening;Right;20 reps;Theraband    Theraband Level (Shoulder Internal Rotation) Level 3 (Green)    Internal Rotation Limitations 2x10    Flexion Strengthening;Right;10 reps;Theraband    Theraband Level (Shoulder Flexion) Level 2 (Red)    Flexion Limitations D1/D2 flexion 10 reps each with R Tband      Shoulder Exercises: ROM/Strengthening   UBE (Upper Arm Bike) L2x79mn fwd/ 3 min back    Lat Pull 10 reps    Lat Pull Limitations 25#    Cybex Row 10 reps    Cybex Row Limitations 25#    Ball on Wall R shld up/down, side to side, circles both ways with beach ball 10 reps each; posterior shoulder engaged    Other ROM/Strengthening Exercises cabinet reaching with 2# weight R UE 10 reps to the 3rd shelf  PT Short Term Goals - 04/23/20 1700      PT SHORT TERM GOAL #1   Title Patient will be independent with initial HEP    Status Achieved             PT Long Term Goals - 04/30/20 1622      PT LONG TERM GOAL #1   Title Patient will be independent with ongoing/advanced HEP +/- home gym program for self-management at home    Status On-going      PT LONG TERM GOAL #2   Title Improve posture and alignment with patient to demonstrate improved upright posture with posterior shoulder girdle engaged    Status Partially Met   posterior shld girdle engaged at rest, slightly fwd trunk lean     PT LONG TERM GOAL #3   Title Patient to improve R shoulder AROM to WNL without pain provocation    Status Achieved      PT LONG TERM GOAL #4   Title Patient will demonstrate improved R shoulder strength to >/= 5/5 for functional UE use    Status Achieved       PT LONG TERM GOAL #5   Title Patient to return to working out w/o limitation due to R shoulder pain    Status On-going      PT LONG TERM GOAL #6   Title Patient to report ability to perform work and daily activities including lifting/carrying his suitcase without pain provocation    Status Achieved                 Plan - 04/30/20 1655    Clinical Impression Statement Pt is showing good progress with PT. He demonstrates globally a 5/5 in R shoulder muscle strength. Also reports returning to work w/o any limitations from his R shoulder, he stated being very cautious about lifting heavy objects to avoid straining his shoulder but reports no pain lifting obejcts. Focused on RTC strengthening and scap stability today with no reports of pain from pt. He needed cueing to keep scapula retracted during the ball on wall exercises. Also TC given to correctly perform D1/D2 flexion. He had met LTGs 4 and 6 and is continuing to show improvement in R shoulder.    Personal Factors and Comorbidities Time since onset of injury/illness/exacerbation;Past/Current Experience;Comorbidity 2    Comorbidities recent episode of shingles in R UE - neuropathic pain & loss of sensation in R lateral forearm, thumb, and index finger 2 shingles; heraing loss    PT Frequency 2x / week    PT Duration 6 weeks    PT Treatment/Interventions ADLs/Self Care Home Management;Cryotherapy;Electrical Stimulation;Iontophoresis 50m/ml Dexamethasone;Moist Heat;Ultrasound;Therapeutic activities;Therapeutic exercise;Neuromuscular re-education;Patient/family education;Manual techniques;Passive range of motion;Dry needling;Taping;Vasopneumatic Device;Joint Manipulations    PT Next Visit Plan postural stretching and scapular strengthening; manual therapy and modalities including possible ionto patch as indicated    PT Home Exercise Plan Access Code: [email or web address] (3/17)    Consulted and Agree with Plan of Care Patient           Patient will  benefit from skilled therapeutic intervention in order to improve the following deficits and impairments:  Decreased activity tolerance,Decreased range of motion,Decreased strength,Increased muscle spasms,Impaired perceived functional ability,Impaired UE functional use,Improper body mechanics,Postural dysfunction,Pain  Visit Diagnosis: Chronic right shoulder pain  Abnormal posture  Other symptoms and signs involving the musculoskeletal system  Muscle weakness (generalized)     Problem List Patient Active Problem List   Diagnosis Date Noted  . Actinic keratosis  03/07/2020  . Benign prostatic hyperplasia 03/07/2020  . Other specified forms of hearing loss 03/07/2020  . 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  [place], [place], [address] Phone: [phone]   Fax:  [phone]  Name: Adam Lindsey MRN: [id number] Date of Birth: August 12, 1950

## 2020-05-07 ENCOUNTER — Encounter: Payer: Self-pay | Admitting: Physical Therapy

## 2020-05-07 ENCOUNTER — Other Ambulatory Visit: Payer: Self-pay

## 2020-05-07 ENCOUNTER — Ambulatory Visit: Payer: Commercial Managed Care - PPO | Admitting: Physical Therapy

## 2020-05-07 DIAGNOSIS — G8929 Other chronic pain: Secondary | ICD-10-CM

## 2020-05-07 DIAGNOSIS — R29898 Other symptoms and signs involving the musculoskeletal system: Secondary | ICD-10-CM

## 2020-05-07 DIAGNOSIS — M25511 Pain in right shoulder: Secondary | ICD-10-CM | POA: Diagnosis not present

## 2020-05-07 DIAGNOSIS — M6281 Muscle weakness (generalized): Secondary | ICD-10-CM

## 2020-05-07 DIAGNOSIS — R293 Abnormal posture: Secondary | ICD-10-CM

## 2020-05-07 NOTE — Therapy (Addendum)
[facility] [address]  [place], [place], [address] Phone: [phone]   Fax:  [phone]  Physical Therapy Treatment / Progress Note / Discharge Summary  Patient Details  Name: Adam Lindsey MRN: [id number] Date of Birth: 1950-09-16 Referring Provider (PT): Eunice Blase, MD  Progress Note  Reporting Period 03/27/2020 to 05/07/2020  See note below for Objective Data and Assessment of Progress/Goals.      Encounter Date: 05/07/2020   PT End of Session - 05/07/20 0844     Visit Number 6    Number of Visits 12    Date for PT Re-Evaluation 05/08/20    Authorization Type UHC, Medicare & Tricare    PT Start Time [phone]    PT Stop Time 0928    PT Time Calculation (min) 44 min    Activity Tolerance Patient tolerated treatment well    Behavior During Therapy [facility] for tasks assessed/performed             History reviewed. No pertinent past medical history.  History reviewed. No pertinent surgical history.  There were no vitals filed for this visit.   Subjective Assessment - 05/07/20 0848     Subjective Pt reports his shoulder is improved but he still tends to be cautious so as to avoid reinjuring it. He feels that he should be ready to continue on his own with the HEP.    Pertinent History 5-6 months ago aggravated by working out    Diagnostic tests 03/20/20 - R shoulder MRI: 1. Moderate tendinosis of the supraspinatus tendon with a small partial-thickness bursal surface tear.  2. Mild tendinosis of the infraspinatus tendon. Severe arthropathy of the acromioclavicular joint.    Patient Stated Goals relieve the pain to get back to work and working out    Currently in Pain? No/denies                [facility] PT Assessment - 05/07/20 0844       Assessment   Medical Diagnosis Right shoulder pain - probable impingement    Referring Provider (PT) Eunice Blase, MD    Onset Date/Surgical Date --   5-6 months  ago   Next MD Visit PRN      Observation/Other Assessments   Focus on Therapeutic Outcomes (FOTO)  Shoulder: 78 (15 point improvement from eval)      AROM   Overall AROM Comments B shoulders symmetrical & WNL      Strength   Right Shoulder Flexion 5/5    Right Shoulder ABduction 4+/5   slight irritation   Right Shoulder Internal Rotation 5/5    Right Shoulder External Rotation 5/5    Left Shoulder Flexion 5/5    Left Shoulder ABduction 5/5    Left Shoulder Internal Rotation 5/5    Left Shoulder External Rotation 5/5                           [facility] Adult PT Treatment/Exercise - 05/07/20 0844       Shoulder Exercises: Standing   External Rotation Right;10 reps;Strengthening;Theraband    Theraband Level (Shoulder External Rotation) Level 3 (Green)    Internal Rotation Right;10 reps;Strengthening;Theraband    Theraband Level (Shoulder Internal Rotation) Level 3 (Green)    Flexion Right;10 reps;Strengthening;Theraband    Theraband Level (Shoulder Flexion) Level 2 (Red)    ABduction Right;10 reps;Strengthening;Theraband    Theraband Level (Shoulder ABduction) Level 2 (Red)  ABduction Limitations scaption    Extension Both;10 reps;Strengthening;Theraband    Theraband Level (Shoulder Extension) Level 3 (Green)    Row Both;10 reps;Strengthening;Theraband    Theraband Level (Shoulder Row) Level 3 (Green)    Row Limitations cues to increase hold time to 3-5 sec and slow eccentric release      Shoulder Exercises: ROM/Strengthening   UBE (Upper Arm Bike) L2.0 x 6 min (3' fwd/3' back)    Ball on Wall R shoulder CW/CCW circles at 90 flexion & scaption x 10 each                    PT Education - 05/07/20 0928     Education Details HEP review/update - [email or web address]    Person(s) Educated Patient    Methods Explanation;Demonstration;Handout    Comprehension Verbalized understanding;Returned demonstration              PT Short Term Goals - 04/23/20 1700        PT SHORT TERM GOAL #1   Title Patient will be independent with initial HEP    Status Achieved               PT Long Term Goals - 05/07/20 0850       PT LONG TERM GOAL #1   Title Patient will be independent with ongoing/advanced HEP +/- home gym program for self-management at home    Status Achieved   05/07/20     PT LONG TERM GOAL #2   Title Improve posture and alignment with patient to demonstrate improved upright posture with posterior shoulder girdle engaged    Status Achieved   05/07/20     PT LONG TERM GOAL #3   Title Patient to improve R shoulder AROM to WNL without pain provocation    Status Achieved   04/23/20     PT LONG TERM GOAL #4   Title Patient will demonstrate improved R shoulder strength to >/= 5/5 for functional UE use    Status Achieved   05/07/20 - met except slight weakness (4+/5) & "irritation" noted with resisted R shoulder abduction     PT LONG TERM GOAL #5   Title Patient to return to working out w/o limitation due to R shoulder pain    Status Achieved   05/07/20     PT LONG TERM GOAL #6   Title Patient to report ability to perform work and daily activities including lifting/carrying his suitcase without pain provocation    Status Achieved   05/07/20                  Plan - 05/07/20 0928     Clinical Impression Statement Joe reports no recent pain in his R shoulder. He has been able to resume most daily activities as well as light workouts w/o issues with his shoulder but does note he tends to still be more careful with how he uses the R arm. He demonstrates improved awareness of his posture and notes his wife reminds him when he starts to slip. R shoulder AROM now WNL and symmetrical to the L shoulder with R shoulder strength grossly 5/5 with exception of abduction 4+/5 with some "irritation" noted on MMT resistance. HEP reviewed and updated today with pt reporting good comfort and understanding with all exercises. All goals essentially  met and pt feels confident with transitioning to the HEP at this time but would like to remain on hold for 30 days in the event that issues  arise which would necessitate a return to PT.    Comorbidities recent episode of shingles in R UE - neuropathic pain & loss of sensation in R lateral forearm, thumb, and index finger 2 shingles; heraing loss    Rehab Potential Good    PT Treatment/Interventions ADLs/Self Care Home Management;Cryotherapy;Electrical Stimulation;Iontophoresis 9m/ml Dexamethasone;Moist Heat;Ultrasound;Therapeutic activities;Therapeutic exercise;Neuromuscular re-education;Patient/family education;Manual techniques;Passive range of motion;Dry needling;Taping;Vasopneumatic Device;Joint Manipulations    PT Next Visit Plan 30-day hold    PT Home Exercise Plan Access Code: [email or web address](3/17), [email or web address] (3/23), [email or web address] (4/13), [email or web address] (4/27)    Consulted and Agree with Plan of Care Patient             Patient will benefit from skilled therapeutic intervention in order to improve the following deficits and impairments:  Decreased activity tolerance,Decreased range of motion,Decreased strength,Increased muscle spasms,Impaired perceived functional ability,Impaired UE functional use,Improper body mechanics,Postural dysfunction,Pain  Visit Diagnosis: Chronic right shoulder pain  Abnormal posture  Other symptoms and signs involving the musculoskeletal system  Muscle weakness (generalized)     Problem List Patient Active Problem List   Diagnosis Date Noted   Actinic keratosis 03/07/2020   Benign prostatic hyperplasia 03/07/2020   Other specified forms of hearing loss 03/07/2020   Sensory hearing loss, bilateral 03/07/2020    JPercival Spanish PT, MPT 05/07/2020, 11:38 AM  [facility] [address] [place] [address]Phone: [phone]  Fax:  [phone] Name: JErle GusterMRN: [id number]  of Birth: [birth year]  PHYSICAL THERAPY DISCHARGE SUMMARY  Visits from Start of Care: 6  Current functional level related to goals / functional outcomes:   Refer to above clinical impression for status as of last visit on 05/07/2020. Patient was placed on hold for 30 days and has not needed to return to PT, therefore will proceed with discharge from PT for this episode.   Remaining deficits:   As above.    Education / Equipment:   HEP   Patient agrees to discharge. Patient goals were met. Patient is being discharged due to meeting the stated rehab goals.   JPercival Spanish PT, MPT 07/18/20, 9:28 AM  [facility] [place] [address]Phone: [phone]  Fax:  [phone]

## 2020-05-07 NOTE — Patient Instructions (Signed)
      Access Code: [email or web address] URL: [email or web address] Date: 05/07/2020 Prepared by: Annie Paras  Exercises Standing High Row with Resistance - 1 x daily - 3 x weekly - 2 sets - 10 reps - 3 sec hold Prone Shoulder Extension on Swiss Ball - 1 x daily - 3 x weekly - 2 sets - 10 reps - 3 sec hold Prone Middle Trapezius Strengthening on Swiss Ball - 1 x daily - 3 x weekly - 2 sets - 10 reps - 3 sec hold Prone Lower Trapezius Strengthening on Swiss Ball - 1 x daily - 3 x weekly - 2 sets - 10 reps - 3 sec hold Prone Shoulder W on [employer] - 1 x daily - 3 x weekly - 2 sets - 10 reps - 3 sec hold

## 2021-07-22 ENCOUNTER — Other Ambulatory Visit: Payer: Self-pay | Admitting: Diagnostic Radiology

## 2021-07-22 DIAGNOSIS — C61 Malignant neoplasm of prostate: Secondary | ICD-10-CM

## 2021-08-05 ENCOUNTER — Other Ambulatory Visit: Payer: Commercial Managed Care - PPO

## 2021-08-14 ENCOUNTER — Ambulatory Visit
Admission: RE | Admit: 2021-08-14 | Discharge: 2021-08-14 | Disposition: A | Discharge status: Home / Self Care | Payer: Medicare Other | Source: Ambulatory Visit | Attending: Diagnostic Radiology | Admitting: Diagnostic Radiology

## 2021-08-14 DIAGNOSIS — C61 Malignant neoplasm of prostate: Secondary | ICD-10-CM

## 2021-08-14 MED ORDER — GADOBENATE DIMEGLUMINE 529 MG/ML IV SOLN
15.0000 mL | Freq: Once | INTRAVENOUS | Status: AC | PRN
  Administered 2021-08-14: 15 mL via INTRAVENOUS

## 2021-10-25 IMAGING — MR MR SHOULDER*R* W/O CM
4 of 5 series · 24 of 40 positions shown · non-contrast
Comparison: None.

CLINICAL DATA: Right shoulder pain, no known injury

EXAM:
MRI OF THE RIGHT SHOULDER WITHOUT CONTRAST
TECHNIQUE: Multiplanar, multisequence MR imaging of the shoulder was performed.
No intravenous contrast was administered.

[Series 6: T2 fat-sat · axial · right · 3.0mm · 0.59mm/px · z∈[-42,+63]mm · 8 of 29 slices shown (1 of 3)]
[im 1/29]
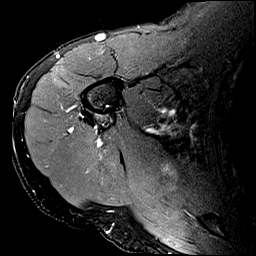
[im 4/29]
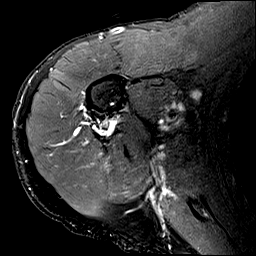
[im 10/29]
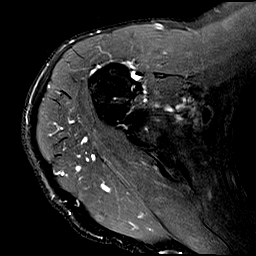
[im 13/29]
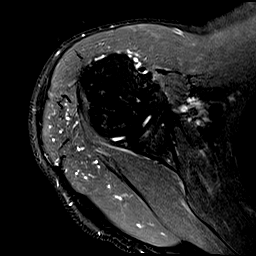
[im 16/29]
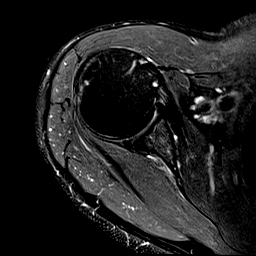
[im 19/29]
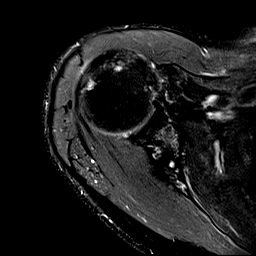
[im 25/29]
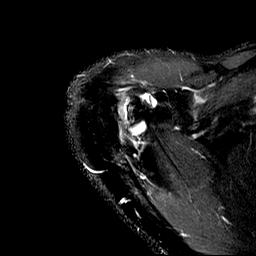
[im 29/29]
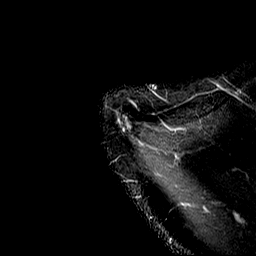

[Series 7: T2 fat-sat · oblique · right · 4.0mm · 0.27mm/px · 6 of 21 slices shown (2 of 3)]
[im 1/21]
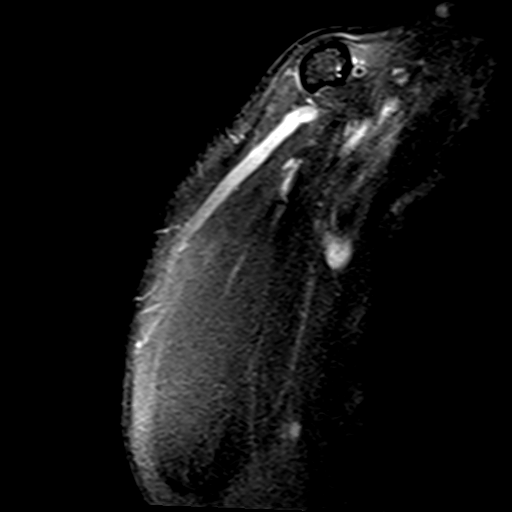
[im 4/21]
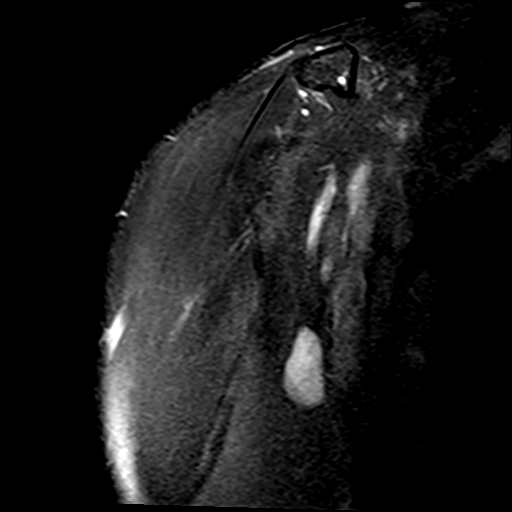
[im 7/21]
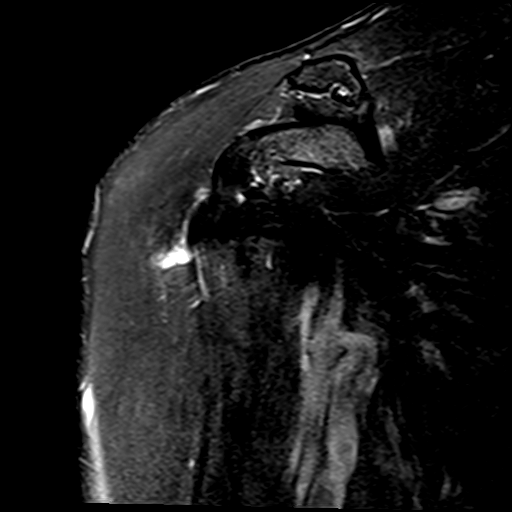
[im 11/21]
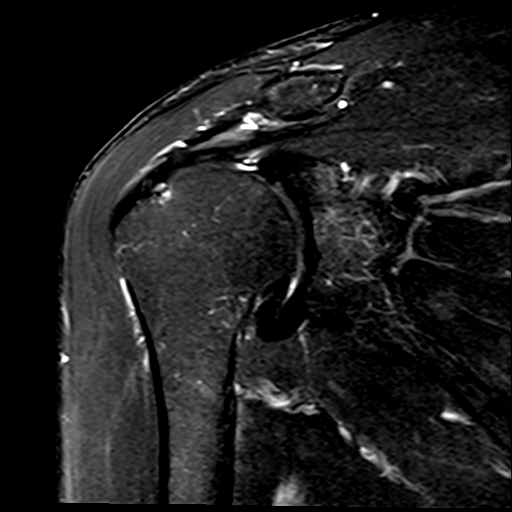
[im 14/21]
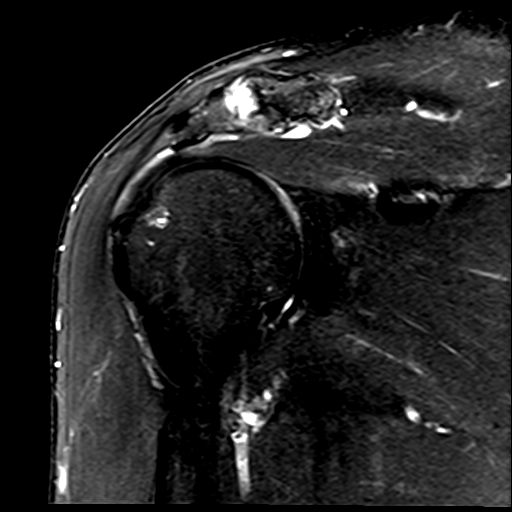
[im 17/21]
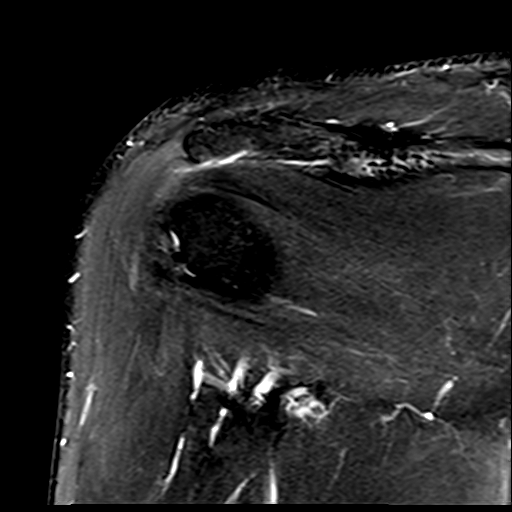

[Series 8: PD · oblique · right · 4.0mm · 0.22mm/px · 7 of 21 slices shown]
[im 1/21]
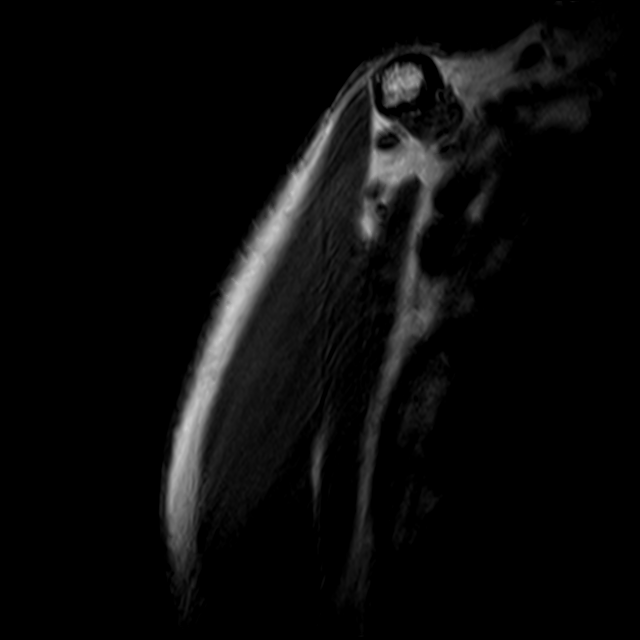
[im 4/21]
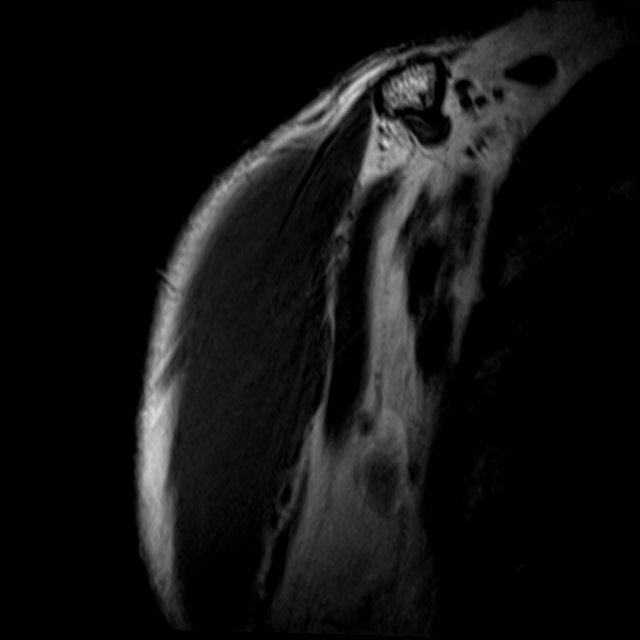
[im 7/21]
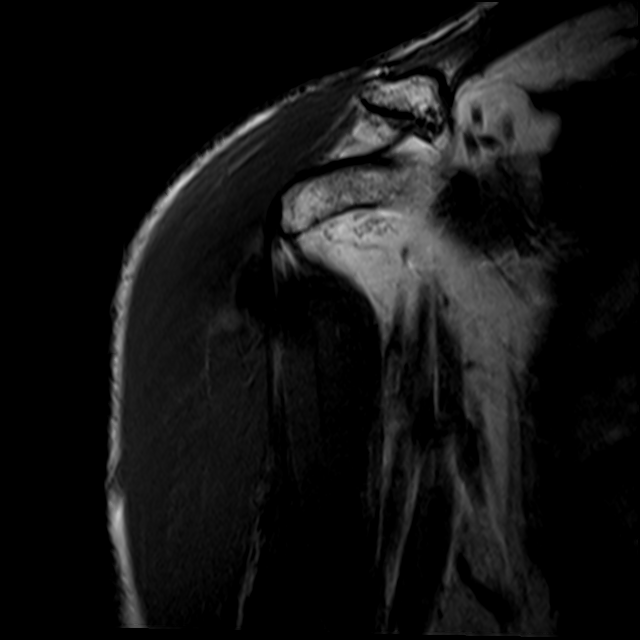
[im 11/21]
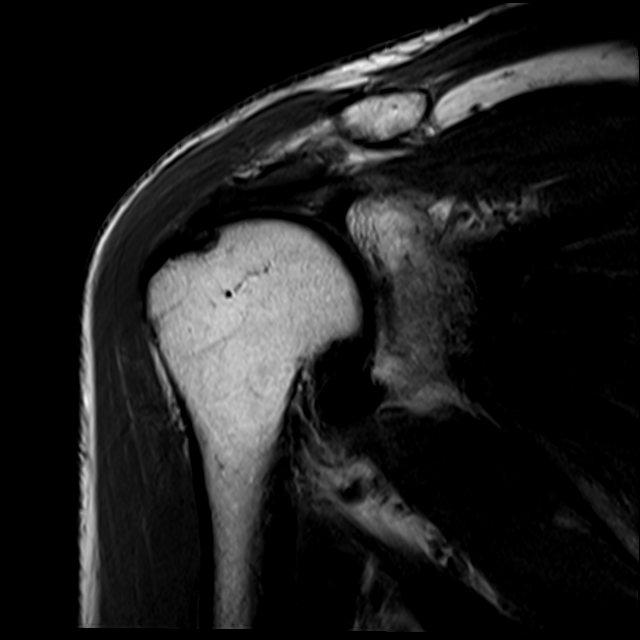
[im 14/21]
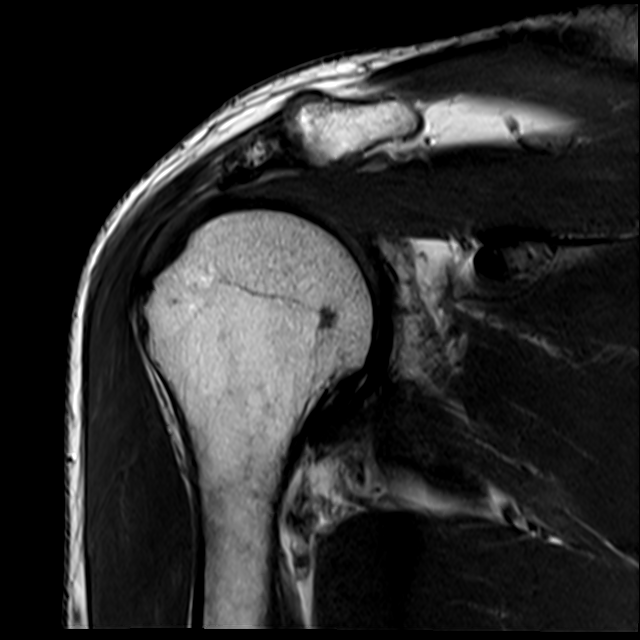
[im 17/21]
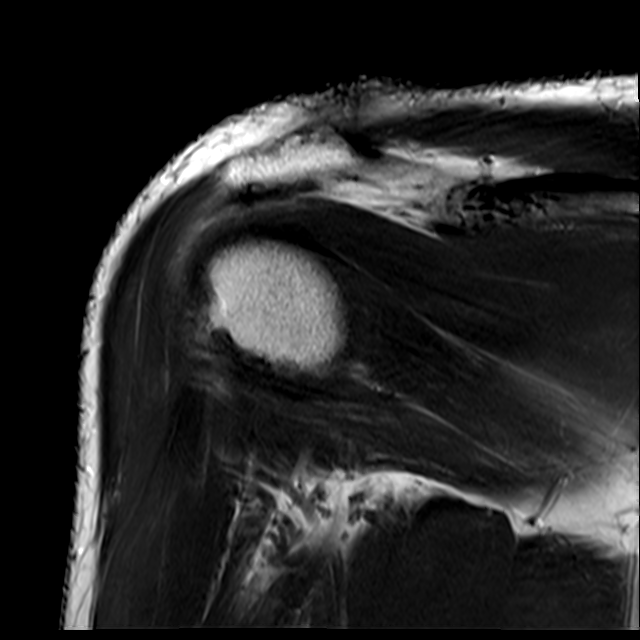
[im 21/21]
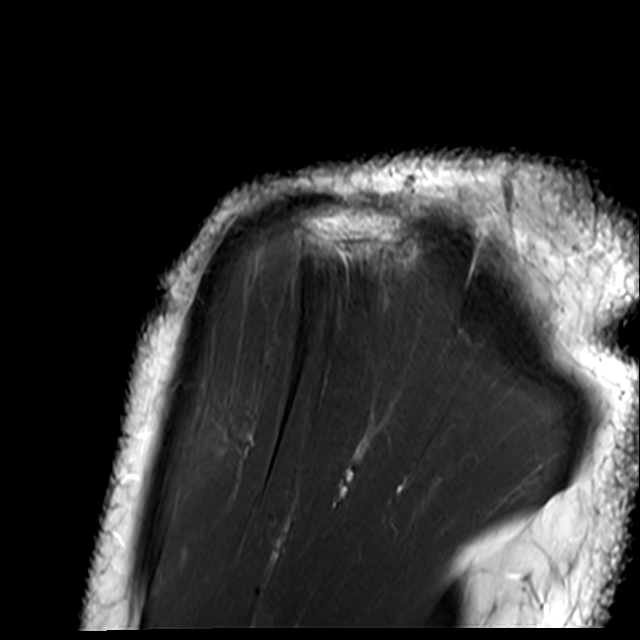

[Series 9: T2 fat-sat · oblique · right · 4.0mm · 0.55mm/px · 3 of 25 slices shown (3 of 3)]
[im 4/25]
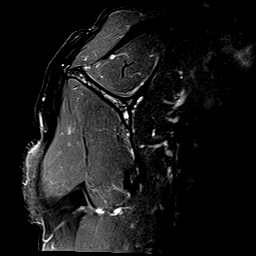
[im 14/25]
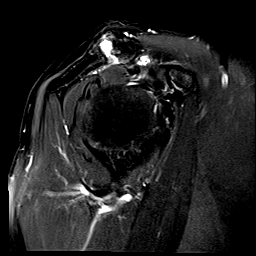
[im 21/25]
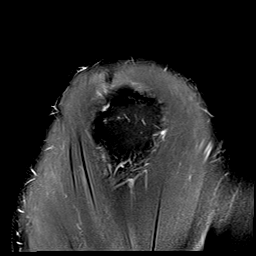

[24 of 40 positions shown; findings below may reference images not displayed]

FINDINGS: Rotator cuff: Moderate tendinosis of the supraspinatus tendon with a
small partial-thickness bursal surface tear. Mild tendinosis of the
infraspinatus tendon. Teres minor tendon is intact. Subscapularis
tendon is intact.

Muscles: No muscle atrophy or edema. No intramuscular fluid
collection or hematoma.

Biceps Long Head: Intraarticular and extraarticular portions of the
biceps tendon are intact.

Acromioclavicular Joint: Severe arthropathy of the acromioclavicular
joint. Type I acromion. Trace subacromial/subdeltoid bursal fluid.

Glenohumeral Joint: No joint effusion. No chondral defect.

Labrum: Small posterior labral tear.

Bones: No fracture or dislocation. No aggressive osseous lesion.

Other: No fluid collection or hematoma.
IMPRESSION: 1. Moderate tendinosis of the supraspinatus tendon with a small
partial-thickness bursal surface tear.
2. Mild tendinosis of the infraspinatus tendon.

## 2021-11-03 ENCOUNTER — Other Ambulatory Visit: Payer: Self-pay | Admitting: Diagnostic Radiology

## 2021-11-03 DIAGNOSIS — C61 Malignant neoplasm of prostate: Secondary | ICD-10-CM

## 2021-11-30 ENCOUNTER — Other Ambulatory Visit: Payer: Commercial Managed Care - PPO

## 2022-02-15 ENCOUNTER — Other Ambulatory Visit (HOSPITAL_COMMUNITY): Payer: Self-pay | Admitting: Urology

## 2022-02-15 DIAGNOSIS — C61 Malignant neoplasm of prostate: Secondary | ICD-10-CM

## 2022-02-23 ENCOUNTER — Ambulatory Visit (HOSPITAL_COMMUNITY)
Admission: RE | Admit: 2022-02-23 | Discharge: 2022-02-23 | Disposition: A | Discharge status: Home / Self Care | Payer: Commercial Managed Care - PPO | Source: Ambulatory Visit | Attending: Urology | Admitting: Urology

## 2022-02-23 DIAGNOSIS — C61 Malignant neoplasm of prostate: Secondary | ICD-10-CM | POA: Insufficient documentation

## 2022-02-23 MED ORDER — PIFLIFOLASTAT F 18 (PYLARIFY) INJECTION
9.0000 | Freq: Once | INTRAVENOUS | Status: AC
Start: 2022-02-23 — End: 2022-02-23
  Administered 2022-02-23: 9.82 via INTRAVENOUS

## 2023-03-22 ENCOUNTER — Other Ambulatory Visit (HOSPITAL_COMMUNITY): Payer: Self-pay | Admitting: Urology

## 2023-03-22 DIAGNOSIS — R9721 Rising PSA following treatment for malignant neoplasm of prostate: Secondary | ICD-10-CM

## 2023-03-29 ENCOUNTER — Encounter (HOSPITAL_COMMUNITY)
Admission: RE | Admit: 2023-03-29 | Discharge: 2023-03-29 | Disposition: A | Discharge status: Home / Self Care | Source: Ambulatory Visit | Attending: Urology | Admitting: Urology

## 2023-03-29 DIAGNOSIS — R9721 Rising PSA following treatment for malignant neoplasm of prostate: Secondary | ICD-10-CM | POA: Diagnosis present

## 2023-03-29 MED ORDER — FLOTUFOLASTAT F 18 GALLIUM 296-5846 MBQ/ML IV SOLN
8.0000 | Freq: Once | INTRAVENOUS | Status: AC
  Administered 2023-03-29: 7.36 via INTRAVENOUS
  Filled 2023-03-29: qty 8

## 2023-03-31 ENCOUNTER — Encounter (HOSPITAL_COMMUNITY)
